# Patient Record
Sex: Female | Born: 2000 | Hispanic: Yes | Marital: Single
Health system: Southern US, Community
[De-identification: ages and names within clinical notes are randomized; demographics above are authoritative.]

---

## 2020-02-01 ENCOUNTER — Emergency Department (HOSPITAL_COMMUNITY): Payer: Self-pay

## 2020-02-01 ENCOUNTER — Encounter (HOSPITAL_COMMUNITY): Payer: Self-pay | Admitting: Emergency Medicine

## 2020-02-01 ENCOUNTER — Other Ambulatory Visit: Payer: Self-pay

## 2020-02-01 ENCOUNTER — Emergency Department (HOSPITAL_COMMUNITY)
Admission: EM | Admit: 2020-02-01 | Discharge: 2020-02-01 | Disposition: A | Discharge status: Home / Self Care | Payer: Self-pay | Attending: Emergency Medicine | Admitting: Emergency Medicine

## 2020-02-01 DIAGNOSIS — R102 Pelvic and perineal pain: Secondary | ICD-10-CM

## 2020-02-01 DIAGNOSIS — Z3A01 Less than 8 weeks gestation of pregnancy: Secondary | ICD-10-CM | POA: Insufficient documentation

## 2020-02-01 DIAGNOSIS — O99891 Other specified diseases and conditions complicating pregnancy: Secondary | ICD-10-CM | POA: Insufficient documentation

## 2020-02-01 DIAGNOSIS — R1032 Left lower quadrant pain: Secondary | ICD-10-CM | POA: Insufficient documentation

## 2020-02-01 LAB — BASIC METABOLIC PANEL
Anion gap: 9 (ref 5–15)
BUN: 12 mg/dL (ref 6–20)
CO2: 25 mmol/L (ref 22–32)
Calcium: 9.7 mg/dL (ref 8.9–10.3)
Chloride: 104 mmol/L (ref 98–111)
Creatinine, Ser: 0.78 mg/dL (ref 0.44–1.00)
GFR calc Af Amer: >60 mL/min (ref >60)
GFR calc non Af Amer: >60 mL/min (ref >60)
Glucose, Bld: 98 mg/dL (ref 70–99)
Potassium: 3.8 mmol/L (ref 3.5–5.1)
Sodium: 138 mmol/L (ref 135–145)

## 2020-02-01 LAB — CBC WITH DIFFERENTIAL/PLATELET
Abs Immature Granulocytes: 0.03 10*3/uL (ref 0.00–0.07)
Basophils Absolute: 0.1 10*3/uL (ref 0.0–0.1)
Basophils Relative: 1 %
Eosinophils Absolute: 0.3 10*3/uL (ref 0.0–0.5)
Eosinophils Relative: 2 %
HCT: 41.6 % (ref 36.0–46.0)
Hemoglobin: 13.2 g/dL (ref 12.0–15.0)
Immature Granulocytes: 0 %
Lymphocytes Relative: 29 %
Lymphs Abs: 3.5 10*3/uL (ref 0.7–4.0)
MCH: 27.2 pg (ref 26.0–34.0)
MCHC: 31.7 g/dL (ref 30.0–36.0)
MCV: 85.6 fL (ref 80.0–100.0)
Monocytes Absolute: 1.2 10*3/uL — ABNORMAL HIGH (ref 0.1–1.0)
Monocytes Relative: 10 %
Neutro Abs: 6.8 10*3/uL (ref 1.7–7.7)
Neutrophils Relative %: 58 %
Platelets: 386 10*3/uL (ref 150–400)
RBC: 4.86 MIL/uL (ref 3.87–5.11)
RDW: 13.2 % (ref 11.5–15.5)
WBC: 11.9 10*3/uL — ABNORMAL HIGH (ref 4.0–10.5)
nRBC: 0 % (ref 0.0–0.2)

## 2020-02-01 LAB — URINALYSIS, ROUTINE W REFLEX MICROSCOPIC
Bilirubin Urine: NEGATIVE
Glucose, UA: NEGATIVE mg/dL
Hgb urine dipstick: NEGATIVE
Ketones, ur: NEGATIVE mg/dL
Leukocytes,Ua: NEGATIVE
Nitrite: NEGATIVE
Protein, ur: NEGATIVE mg/dL
Specific Gravity, Urine: 1.006 (ref 1.005–1.030)
pH: 7 (ref 5.0–8.0)

## 2020-02-01 LAB — ABO/RH
ABO/RH(D): O NEG
Antibody Screen: NEGATIVE

## 2020-02-01 LAB — POC URINE PREG, ED: Preg Test, Ur: POSITIVE — AB

## 2020-02-01 LAB — WET PREP, GENITAL
Clue Cells Wet Prep HPF POC: NONE SEEN
Sperm: NONE SEEN
Trich, Wet Prep: NONE SEEN
Yeast Wet Prep HPF POC: NONE SEEN

## 2020-02-01 LAB — HCG, QUANTITATIVE, PREGNANCY: hCG, Beta Chain, Quant, S: 235 m[IU]/mL — ABNORMAL HIGH (ref <5)

## 2020-02-01 NOTE — ED Triage Notes (Addendum)
Patient c/o constant lower abdominal pain x4 days. Reports positive pregnancy test yesterday. States LMP 8/15. Patient is spanish speaking. Interpreter Mangum [phone] used to complete triage.

## 2020-02-01 NOTE — ED Provider Notes (Signed)
[facility]-EMERGENCY DEPT Provider Note   CSN: [id number] Arrival date & time: 02/01/20  2017     History Chief Complaint  Patient presents with  . Abdominal Pain    Elizabeth Kaiser is a 19 y.o. female.  The history is provided by the patient. A language interpreter was used Olegario Messier - spanish [phone]).  Abdominal Pain Pain location:  RLQ and LLQ Pain radiates to:  Does not radiate Pain severity:  Moderate Onset quality:  Gradual Duration:  4 days Timing:  Constant Progression:  Worsening Chronicity:  New Relieved by:  Nothing Worsened by:  Nothing Associated symptoms: fatigue   Associated symptoms: no cough, no dysuria, no fever, no vaginal bleeding, no vaginal discharge and no vomiting   Risk factors: pregnancy   pt is a G1P0 with lower abdominal pain for 4 days She feels like something needs to come "out" Patient request that her sister-in-law be present for entire history and physical   PMH-none Soc hx -she is a nonsmoker OB History   No obstetric history on file.     No family history on file.  Social History   Tobacco Use  . Smoking status: Not on file  Substance Use Topics  . Alcohol use: Not on file  . Drug use: Not on file    Home Medications Prior to Admission medications   Not on File    Allergies    Patient has no known allergies.  Review of Systems   Review of Systems  Constitutional: Positive for fatigue. Negative for fever.  Respiratory: Negative for cough.   Gastrointestinal: Positive for abdominal pain. Negative for vomiting.  Genitourinary: Positive for pelvic pain. Negative for dysuria, vaginal bleeding and vaginal discharge.  All other systems reviewed and are negative.   Physical Exam Updated Vital Signs BP 123/85 (BP Location: Left Arm)   Pulse 97   Temp 97.8 F (36.6 C) (Oral)   Resp 14   LMP 12/26/2019   SpO2 98%   Physical Exam CONSTITUTIONAL: Well developed/well nourished HEAD:  Normocephalic/atraumatic EYES: EOMI/PERRL ENMT: Mucous membranes moist NECK: supple no meningeal signs SPINE/BACK:entire spine nontender CV: S1/S2 noted, no murmurs/rubs/gallops noted LUNGS: Lungs are clear to auscultation bilaterally, no apparent distress ABDOMEN: soft, nontender, no rebound or guarding, bowel sounds noted throughout abdomen GU:no cva tenderness, no CMT, no discharge, no leaking of fluids.  No active bleeding.  Small amt of bleeding noted after patient was swabbed for GC/Chlamydia. Nurse tech Nettie Elm Female chaperone present. Pt declined having language interpreter present during pelvic.  She requested sister in law to be present for entire exam NEURO: Pt is awake/alert/appropriate, moves all extremitiesx4.  No facial droop.   EXTREMITIES: pulses normal/equal, full ROM SKIN: warm, color normal PSYCH: no abnormalities of mood noted, alert and oriented to situation  ED Results / Procedures / Treatments   Labs (all labs ordered are listed, but only abnormal results are displayed) Labs Reviewed  WET PREP, GENITAL - Abnormal; Notable for the following components:      Result Value   WBC, Wet Prep HPF POC FEW (*)    All other components within normal limits  CBC WITH DIFFERENTIAL/PLATELET - Abnormal; Notable for the following components:   WBC 11.9 (*)    Monocytes Absolute 1.2 (*)    All other components within normal limits  HCG, QUANTITATIVE, PREGNANCY - Abnormal; Notable for the following components:   hCG, Beta Chain, Quant, S 235 (*)    All other components within normal limits  URINALYSIS, ROUTINE W REFLEX MICROSCOPIC - Abnormal; Notable for the following components:   Color, Urine STRAW (*)    All other components within normal limits  POC URINE PREG, ED - Abnormal; Notable for the following components:   Preg Test, Ur POSITIVE (*)    All other components within normal limits  BASIC METABOLIC PANEL  ABO/RH  GC/CHLAMYDIA PROBE AMP ([place]) NOT AT [facility]     EKG None  Radiology US OB LESS THAN 14 WEEKS WITH OB TRANSVAGINAL  Result Date: 02/01/2020 CLINICAL DATA:  Initial evaluation for acute pelvic pain for 4 days. EXAM: OBSTETRIC <14 WK [place] AND TRANSVAGINAL OB US TECHNIQUE: Both transabdominal and transvaginal ultrasound examinations were performed for complete evaluation of the gestation as well as the maternal uterus, adnexal regions, and pelvic cul-de-sac. Transvaginal technique was performed to assess early pregnancy. COMPARISON:  None available. FINDINGS: Intrauterine gestational sac: Negative. Yolk sac:  Negative. Embryo:  Negative. Cardiac Activity: Negative. Subchorionic hemorrhage:  None visualized. Maternal uterus/adnexae: Ovaries are within normal limits bilaterally. Probable small degenerating corpus luteal cyst noted within the left ovary. No adnexal mass or free fluid. Endometrial stripe thickened measuring up to 23 mm. IMPRESSION: 1. Early pregnancy with no discrete IUP or adnexal mass identified. Finding is consistent with a pregnancy of unknown anatomic location. Differential considerations include IUP to early to visualize, recent SAB, or possibly occult ectopic pregnancy. Close clinical monitoring with serial beta HCGs and close interval follow-up ultrasound recommended as clinically warranted. 2. No other acute maternal uterine or adnexal abnormality identified. Electronically Signed   By: Rise Mu M.D.   On: 02/01/2020 22:20    Procedures Procedures  Medications Ordered in ED Medications - No data to display  ED Course  I have reviewed the triage vital signs and the nursing notes.  Pertinent labs & imaging results that were available during my care of the patient were reviewed by me and considered in my medical decision making (see chart for details).    MDM Rules/Calculators/A&P                          Patient is an 19 year old G1, P0 at approximately 5 weeks by LMP presenting with lower abdominal pain.  Overall her abdominal exam is unremarkable. Pelvic exam unremarkable. TV ultrasound reveals early pregnancy but no definitive IUP. hCG quant 235. Discussed with Dr. Adrian Blackwater with OB/GYN. He will arrange close follow-up this week for repeat hCG. Encourage patient to take prenatal vitamins. Discussed return precautions with interpreter. Final Clinical Impression(s) / ED Diagnoses Final diagnoses:  Less than [redacted] weeks gestation of pregnancy    Rx / DC Orders ED Discharge Orders    None       Zadie Rhine, MD 02/01/20 2229

## 2020-02-01 NOTE — ED Notes (Signed)
ED Provider at bedside. 

## 2020-02-02 LAB — GC/CHLAMYDIA PROBE AMP (~~LOC~~) NOT AT ARMC
Chlamydia: NEGATIVE
Comment: NEGATIVE
Comment: NORMAL
Neisseria Gonorrhea: NEGATIVE

## 2020-02-03 ENCOUNTER — Other Ambulatory Visit: Payer: Self-pay

## 2020-02-03 ENCOUNTER — Ambulatory Visit (INDEPENDENT_AMBULATORY_CARE_PROVIDER_SITE_OTHER): Payer: Self-pay | Admitting: *Deleted

## 2020-02-03 ENCOUNTER — Telehealth: Payer: Self-pay | Admitting: *Deleted

## 2020-02-03 VITALS — Ht 63.0 in | Wt 120.1 lb

## 2020-02-03 DIAGNOSIS — O3680X0 Pregnancy with inconclusive fetal viability, not applicable or unspecified: Secondary | ICD-10-CM

## 2020-02-03 LAB — BETA HCG QUANT (REF LAB): hCG Quant: 466 m[IU]/mL

## 2020-02-03 NOTE — Progress Notes (Signed)
Stat Beta Hcg 466. Spoke with Dr. Donavan Foil who recommends Stat beta Hcg on Saturday and a follow up US in 2 weeks.   [place] scheduled for 10/05 at 10:00, patient is to arrive at 9:45 am with a full bladder.   Stat Beta orders placed to be drawn in MAU on Saturday.   Called patient at 1:16 pm with assistance of [employer], Spanish Interpreter to give results and recommendation for follow up. Patient did not answer. Will attempt again at a later time.

## 2020-02-03 NOTE — Progress Notes (Signed)
Here for stat bhcg. C/o moderate pelvic  Pain- today, states was strong when she went to ED but not today. . Denies  Bleeding today.  Explained we will draw stat bhcg and then she can leave and we will call her in about 2 hours with results and plan of care.  She voices understanding.  Ethel Veronica,RN

## 2020-02-03 NOTE — Telephone Encounter (Signed)
Pt left VM message with her phone number only.  She did not state her question or concern.

## 2020-02-03 NOTE — Progress Notes (Signed)
Called patient with Elizabeth Kaiser, Interpreter and gave her results/ plan of care . Also reviewed ectopic precautions with her.  She voices understanding. Vang Kraeger,RN

## 2020-02-04 NOTE — Telephone Encounter (Addendum)
Patient was called at end of day yesterday with results.  I called Carlee today with Jaci Lazier [phone] And informed her I was calling back to follow up if her question was answered or was after her last call with me yesterday. She states she wanted to know address of where she goes tomorrow. I gave her name and address [facility] She voices understanding. Fionna Merriott,RN

## 2020-02-04 NOTE — Progress Notes (Signed)
Patient was assessed and managed by nursing staff during this encounter. I have reviewed the chart and agree with the documentation and plan. I have also made any necessary editorial changes.  Warden Fillers, MD 02/04/2020 8:10 AM

## 2020-02-05 ENCOUNTER — Encounter (HOSPITAL_COMMUNITY): Payer: Self-pay | Admitting: Obstetrics & Gynecology

## 2020-02-05 ENCOUNTER — Inpatient Hospital Stay (HOSPITAL_COMMUNITY)
Admission: AD | Admit: 2020-02-05 | Discharge: 2020-02-05 | Disposition: A | Discharge status: Home / Self Care | Payer: Self-pay | Attending: Obstetrics & Gynecology | Admitting: Obstetrics & Gynecology

## 2020-02-05 ENCOUNTER — Other Ambulatory Visit: Payer: Self-pay

## 2020-02-05 DIAGNOSIS — O0281 Inappropriate change in quantitative human chorionic gonadotropin (hCG) in early pregnancy: Secondary | ICD-10-CM

## 2020-02-05 DIAGNOSIS — E349 Endocrine disorder, unspecified: Secondary | ICD-10-CM

## 2020-02-05 DIAGNOSIS — Z3A01 Less than 8 weeks gestation of pregnancy: Secondary | ICD-10-CM | POA: Insufficient documentation

## 2020-02-05 DIAGNOSIS — O26891 Other specified pregnancy related conditions, first trimester: Secondary | ICD-10-CM | POA: Insufficient documentation

## 2020-02-05 DIAGNOSIS — R799 Abnormal finding of blood chemistry, unspecified: Secondary | ICD-10-CM | POA: Insufficient documentation

## 2020-02-05 LAB — HCG, QUANTITATIVE, PREGNANCY: hCG, Beta Chain, Quant, S: 1184 m[IU]/mL — ABNORMAL HIGH (ref <5)

## 2020-02-05 NOTE — Discharge Instructions (Signed)
First Trimester of Pregnancy The first trimester of pregnancy is from week 1 until the end of week 13 (months 1 through 3). A week after a sperm fertilizes an egg, the egg will implant on the wall of the uterus. This embryo will begin to develop into a baby. Genes from you and your partner will form the baby. The female genes will determine whether the baby will be a boy or a girl. At 6-8 weeks, the eyes and face will be formed, and the heartbeat can be seen on ultrasound. At the end of 12 weeks, all the baby's organs will be formed. Now that you are pregnant, you will want to do everything you can to have a healthy baby. Two of the most important things are to get good prenatal care and to follow your health care provider's instructions. Prenatal care is all the medical care you receive before the baby's birth. This care will help prevent, find, and treat any problems during the pregnancy and childbirth. Body changes during your first trimester Your body goes through many changes during pregnancy. The changes vary from woman to woman.  You may gain or lose a couple of pounds at first.  You may feel sick to your stomach (nauseous) and you may throw up (vomit). If the vomiting is uncontrollable, call your health care provider.  You may tire easily.  You may develop headaches that can be relieved by medicines. All medicines should be approved by your health care provider.  You may urinate more often. Painful urination may mean you have a bladder infection.  You may develop heartburn as a result of your pregnancy.  You may develop constipation because certain hormones are causing the muscles that push stool through your intestines to slow down.  You may develop hemorrhoids or swollen veins (varicose veins).  Your breasts may begin to grow larger and become tender. Your nipples may stick out more, and the tissue that surrounds them (areola) may become darker.  Your gums may bleed and may be  sensitive to brushing and flossing.  Dark spots or blotches (chloasma, mask of pregnancy) may develop on your face. This will likely fade after the baby is born.  Your menstrual periods will stop.  You may have a loss of appetite.  You may develop cravings for certain kinds of food.  You may have changes in your emotions from day to day, such as being excited to be pregnant or being concerned that something may go wrong with the pregnancy and baby.  You may have more vivid and strange dreams.  You may have changes in your hair. These can include thickening of your hair, rapid growth, and changes in texture. Some women also have hair loss during or after pregnancy, or hair that feels dry or thin. Your hair will most likely return to normal after your baby is born. What to expect at prenatal visits During a routine prenatal visit:  You will be weighed to make sure you and the baby are growing normally.  Your blood pressure will be taken.  Your abdomen will be measured to track your baby's growth.  The fetal heartbeat will be listened to between weeks 10 and 14 of your pregnancy.  Test results from any previous visits will be discussed. Your health care provider may ask you:  How you are feeling.  If you are feeling the baby move.  If you have had any abnormal symptoms, such as leaking fluid, bleeding, severe headaches, or abdominal   cramping.  If you are using any tobacco products, including cigarettes, chewing tobacco, and electronic cigarettes.  If you have any questions. Other tests that may be performed during your first trimester include:  Blood tests to find your blood type and to check for the presence of any previous infections. The tests will also be used to check for low iron levels (anemia) and protein on red blood cells (Rh antibodies). Depending on your risk factors, or if you previously had diabetes during pregnancy, you may have tests to check for high blood sugar  that affects pregnant women (gestational diabetes).  Urine tests to check for infections, diabetes, or protein in the urine.  An ultrasound to confirm the proper growth and development of the baby.  Fetal screens for spinal cord problems (spina bifida) and Down syndrome.  HIV (human immunodeficiency virus) testing. Routine prenatal testing includes screening for HIV, unless you choose not to have this test.  You may need other tests to make sure you and the baby are doing well. Follow these instructions at home: Medicines  Follow your health care provider's instructions regarding medicine use. Specific medicines may be either safe or unsafe to take during pregnancy.  Take a prenatal vitamin that contains at least 600 micrograms (mcg) of folic acid.  If you develop constipation, try taking a stool softener if your health care provider approves. Eating and drinking   Eat a balanced diet that includes fresh fruits and vegetables, whole grains, good sources of protein such as meat, eggs, or tofu, and low-fat dairy. Your health care provider will help you determine the amount of weight gain that is right for you.  Avoid raw meat and uncooked cheese. These carry germs that can cause birth defects in the baby.  Eating four or five small meals rather than three large meals a day may help relieve nausea and vomiting. If you start to feel nauseous, eating a few soda crackers can be helpful. Drinking liquids between meals, instead of during meals, also seems to help ease nausea and vomiting.  Limit foods that are high in fat and processed sugars, such as fried and sweet foods.  To prevent constipation: ? Eat foods that are high in fiber, such as fresh fruits and vegetables, whole grains, and beans. ? Drink enough fluid to keep your urine clear or pale yellow. Activity  Exercise only as directed by your health care provider. Most women can continue their usual exercise routine during  pregnancy. Try to exercise for 30 minutes at least 5 days a week. Exercising will help you: ? Control your weight. ? Stay in shape. ? Be prepared for labor and delivery.  Experiencing pain or cramping in the lower abdomen or lower back is a good sign that you should stop exercising. Check with your health care provider before continuing with normal exercises.  Try to avoid standing for long periods of time. Move your legs often if you must stand in one place for a long time.  Avoid heavy lifting.  Wear low-heeled shoes and practice good posture.  You may continue to have sex unless your health care provider tells you not to. Relieving pain and discomfort  Wear a good support bra to relieve breast tenderness.  Take warm sitz baths to soothe any pain or discomfort caused by hemorrhoids. Use hemorrhoid cream if your health care provider approves.  Rest with your legs elevated if you have leg cramps or low back pain.  If you develop varicose veins in   your legs, wear support hose. Elevate your feet for 15 minutes, 3-4 times a day. Limit salt in your diet. Prenatal care  Schedule your prenatal visits by the twelfth week of pregnancy. They are usually scheduled monthly at first, then more often in the last 2 months before delivery.  Write down your questions. Take them to your prenatal visits.  Keep all your prenatal visits as told by your health care provider. This is important. Safety  Wear your seat belt at all times when driving.  Make a list of emergency phone numbers, including numbers for family, friends, the hospital, and police and fire departments. General instructions  Ask your health care provider for a referral to a local prenatal education class. Begin classes no later than the beginning of month 6 of your pregnancy.  Ask for help if you have counseling or nutritional needs during pregnancy. Your health care provider can offer advice or refer you to specialists for help  with various needs.  Do not use hot tubs, steam rooms, or saunas.  Do not douche or use tampons or scented sanitary pads.  Do not cross your legs for long periods of time.  Avoid cat litter boxes and soil used by cats. These carry germs that can cause birth defects in the baby and possibly loss of the fetus by miscarriage or stillbirth.  Avoid all smoking, herbs, alcohol, and medicines not prescribed by your health care provider. Chemicals in these products affect the formation and growth of the baby.  Do not use any products that contain nicotine or tobacco, such as cigarettes and e-cigarettes. If you need help quitting, ask your health care provider. You may receive counseling support and other resources to help you quit.  Schedule a dentist appointment. At home, brush your teeth with a soft toothbrush and be gentle when you floss. Contact a health care provider if:  You have dizziness.  You have mild pelvic cramps, pelvic pressure, or nagging pain in the abdominal area.  You have persistent nausea, vomiting, or diarrhea.  You have a bad smelling vaginal discharge.  You have pain when you urinate.  You notice increased swelling in your face, hands, legs, or ankles.  You are exposed to fifth disease or chickenpox.  You are exposed to [place] measles (rubella) and have never had it. Get help right away if:  You have a fever.  You are leaking fluid from your vagina.  You have spotting or bleeding from your vagina.  You have severe abdominal cramping or pain.  You have rapid weight gain or loss.  You vomit blood or material that looks like coffee grounds.  You develop a severe headache.  You have shortness of breath.  You have any kind of trauma, such as from a fall or a car accident. Summary  The first trimester of pregnancy is from week 1 until the end of week 13 (months 1 through 3).  Your body goes through many changes during pregnancy. The changes vary from  woman to woman.  You will have routine prenatal visits. During those visits, your health care provider will examine you, discuss any test results you may have, and talk with you about how you are feeling. This information is not intended to replace advice given to you by your health care provider. Make sure you discuss any questions you have with your health care provider. Document Revised: 04/11/2017 Document Reviewed: 04/10/2016 Elsevier Patient Education  2020 [employer].  Safe Medications in Pregnancy  Acne: Benzoyl Peroxide Salicylic Acid  Backache/Headache: Tylenol: 2 regular strength every 4 hours OR              2 Extra strength every 6 hours  Colds/Coughs/Allergies: Benadryl (alcohol free) 25 mg every 6 hours as needed Breath right strips Claritin Cepacol throat lozenges Chloraseptic throat spray Cold-Eeze- up to three times per day Cough drops, alcohol free Flonase (by prescription only) Guaifenesin Mucinex Robitussin DM (plain only, alcohol free) Saline nasal spray/drops Sudafed (pseudoephedrine) & Actifed ** use only after [redacted] weeks gestation and if you do not have high blood pressure Tylenol Vicks Vaporub Zinc lozenges Zyrtec   Constipation: Colace Ducolax suppositories Fleet enema Glycerin suppositories Metamucil Milk of magnesia Miralax Senokot Smooth move tea  Diarrhea: Kaopectate Imodium A-D  *NO pepto Bismol  Hemorrhoids: Anusol Anusol HC Preparation H Tucks  Indigestion: Tums Maalox Mylanta Zantac  Pepcid  Insomnia: Benadryl (alcohol free) 25mg every 6 hours as needed Tylenol PM Unisom, no Gelcaps  Leg Cramps: Tums MagGel  Nausea/Vomiting:  Bonine Dramamine Emetrol Ginger extract Sea bands Meclizine  Nausea medication to take during pregnancy:  Unisom (doxylamine succinate 25 mg tablets) Take one tablet daily at bedtime. If symptoms are not adequately controlled, the dose can be increased to a maximum recommended  dose of two tablets daily (1/2 tablet in the morning, 1/2 tablet mid-afternoon and one at bedtime). Vitamin B6 100mg tablets. Take one tablet twice a day (up to 200 mg per day).  Skin Rashes: Aveeno products Benadryl cream or 25mg every 6 hours as needed Calamine Lotion 1% cortisone cream  Yeast infection: Gyne-lotrimin 7 Monistat 7   **If taking multiple medications, please check labels to avoid duplicating the same active ingredients **take medication as directed on the label ** Do not exceed 4000 mg of tylenol in 24 hours **Do not take medications that contain aspirin or ibuprofen      

## 2020-02-05 NOTE — MAU Note (Signed)
Elizabeth Kaiser is a 19 y.o. here in MAU reporting: here for follow up hcg. Denies pain or bleeding.  Pain score: 0/10  Vitals:   02/05/20 0839  BP: (!) 113/59  Pulse: 95  Resp: 16  Temp: 98.9 F (37.2 C)  SpO2: 100%     Lab orders placed from triage: hcg

## 2020-02-05 NOTE — MAU Provider Note (Signed)
History   Chief Complaint:  Follow-up   Elizabeth Kaiser is  19 y.o. G1P0 Patient's last menstrual period was 12/26/2019.Marland Kitchen Patient is here for follow up of quantitative HCG and ongoing surveillance of pregnancy status. She is [redacted]w[redacted]d weeks gestation  by LMP.    Since her last visit, the patient is without new complaint. The patient reports bleeding as  none now.  She denies any pain.  General ROS:  negative  Her previous Quantitative HCG values are:  Results for Elizabeth, Kaiser (MRN [id number]) as of 02/05/2020 10:09  Ref. Range 02/01/2020 20:50  HCG, Beta Chain, Quant, S Latest Ref Range: <5 mIU/mL 235 (H)   Results for Elizabeth, Kaiser (MRN [id number]) as of 02/05/2020 10:09  Ref. Range 02/03/2020 11:15  hCG Quant Latest Units: mIU/mL 466    Physical Exam   Blood pressure (!) 113/59, pulse 95, temperature 98.9 F (37.2 C), temperature source Oral, resp. rate 16, last menstrual period 12/26/2019, SpO2 100 %.  Focused Gynecological Exam: examination not indicated  Labs: Results for orders placed or performed during the hospital encounter of 02/05/20 (from the past 24 hour(s))  hCG, quantitative, pregnancy   Collection Time: 02/05/20  8:46 AM  Result Value Ref Range   hCG, Beta Chain, Quant, S 1,184 (H) <5 mIU/mL    Assessment:   1. Elevated serum hCG   2. [redacted] weeks gestation of pregnancy     Plan: -Discharge home in stable condition -First trimester precautions discussed -Patient advised to follow-up with [facility] on 10/5 for ultrasound as scheduled -Patient may return to MAU as needed or if her condition were to change or worsen  Rolm Bookbinder, CNM 02/05/2020, 10:09 AM

## 2020-02-15 ENCOUNTER — Other Ambulatory Visit: Payer: Self-pay

## 2020-02-15 ENCOUNTER — Ambulatory Visit
Admission: RE | Admit: 2020-02-15 | Discharge: 2020-02-15 | Disposition: A | Discharge status: Home / Self Care | Payer: Self-pay | Source: Ambulatory Visit | Attending: Obstetrics and Gynecology | Admitting: Obstetrics and Gynecology

## 2020-02-15 DIAGNOSIS — O3680X0 Pregnancy with inconclusive fetal viability, not applicable or unspecified: Secondary | ICD-10-CM

## 2020-03-03 ENCOUNTER — Encounter: Payer: Self-pay | Admitting: Student in an Organized Health Care Education/Training Program

## 2020-03-10 ENCOUNTER — Other Ambulatory Visit: Payer: Self-pay

## 2020-03-10 ENCOUNTER — Ambulatory Visit (INDEPENDENT_AMBULATORY_CARE_PROVIDER_SITE_OTHER): Payer: Self-pay | Admitting: Student in an Organized Health Care Education/Training Program

## 2020-03-10 VITALS — BP 99/70 | Ht 66.14 in | Wt 121.6 lb

## 2020-03-10 DIAGNOSIS — Z3491 Encounter for supervision of normal pregnancy, unspecified, first trimester: Secondary | ICD-10-CM

## 2020-03-10 DIAGNOSIS — Z3490 Encounter for supervision of normal pregnancy, unspecified, unspecified trimester: Secondary | ICD-10-CM | POA: Insufficient documentation

## 2020-03-10 DIAGNOSIS — Z3A10 10 weeks gestation of pregnancy: Secondary | ICD-10-CM

## 2020-03-10 LAB — POCT URINALYSIS DIP (MANUAL ENTRY)
Bilirubin, UA: NEGATIVE
Blood, UA: NEGATIVE
Glucose, UA: NEGATIVE mg/dL
Ketones, POC UA: NEGATIVE mg/dL
Leukocytes, UA: NEGATIVE
Nitrite, UA: NEGATIVE
Protein Ur, POC: NEGATIVE mg/dL
Spec Grav, UA: 1.015 (ref 1.010–1.025)
Urobilinogen, UA: 0.2 E.U./dL
pH, UA: 7 (ref 5.0–8.0)

## 2020-03-10 NOTE — Patient Instructions (Addendum)
It was a pleasure to see you today!  To summarize our discussion for this visit:  We are obtaining basic blood work and urine testing for initial OB visit. You will return at your earliest convenience for a thorough OB visit where your provider can review the information from the labs with you as well as go over many more questions.   For the nausea, you can use  Doxylamine + vitamin B6  Ginger lozenges Small frequent meals every two hours starting from when you wake up Avoid spicy or greasy foods  Some additional health maintenance measures we should update are: Health Maintenance Due  Topic Date Due  . Hepatitis C Screening  Never done  . HIV Screening  Never done  . INFLUENZA VACCINE  Never done  . TETANUS/TDAP  Never done  .    Call the clinic at [phone] if your symptoms worsen or you have any concerns.   Thank you for allowing me to take part in your care,  Dr. Jamelle Rushing   Primer trimestre de Elizabeth Kaiser First Trimester of Pregnancy  El primer trimestre de embarazo se extiende desde la semana1 hasta el final de la semana13 (mes1 al mes3). Durante este tiempo, el beb comenzar a desarrollarse dentro suyo. Entre la semana6 y [place], se forman los ojos y Recruitment consultant, y los latidos del corazn pueden escucharse en la ecografa. Al final de las 12semanas, todos los rganos del beb estn formados. El cuidado prenatal es toda la asistencia mdica que usted recibe antes del nacimiento del beb. Asegrese de recibir un buen cuidado prenatal y de seguir todas las indicaciones del mdico. Siga estas indicaciones en su casa: Medicamentos  Tome los medicamentos de venta libre y los recetados solamente como se lo haya indicado el mdico. Algunos medicamentos son seguros para tomar durante el Psychiatrist y otros no lo son.  Tome vitaminas prenatales que contengan por lo menos (?g) de cido flico.  Si tiene problemas para defecar (estreimiento), tome un  medicamento que ablanda la materia fecal (laxante), siempre que lo autorice el mdico. Comida y bebida   Ingiera alimentos saludables de [place] regular.  El Firefighter la cantidad de peso que [place].  No coma carne cruda ni quesos sin cocinar.  Si tiene Programme researcher, broadcasting/film/video (nuseas) o vomita (vmitos): ? Ingiera 4 o 5comidas pequeas por Geophysical data processor de 3abundantes. ? Intente comer algunas galletitas saladas. ? Beba lquidos [employer], en lugar de [employer].  Para evitar el estreimiento: ? Consuma alimentos ricos en fibra, como frutas y verduras frescas, cereales integrales y legumbres. ? Beba suficiente lquido para mantener el pis (orina) claro o de color amarillo plido. Actividad  Haga ejercicios solamente como se lo haya indicado el mdico. Deje de hacer ejercicios si tiene clicos o dolor en la parte baja del vientre (abdomen) o en la cintura.  No haga actividad fsica si el clima est demasiado caluroso o hmedo, o si se encuentra en un lugar muy alto (altitud elevada).  Intente no estar de pie [employer]. Mueva las piernas con frecuencia si debe estar de pie en un lugar durante mucho tiempo.  Evite levantar pesos [employer].  Use zapatos con tacones bajos. Mantenga una buena postura al sentarse y pararse.  Puede tener [employer] [place], a menos que el mdico le indique lo contrario. Alivio del dolor y del Dentist  Use un sostn que le brinde buen soporte si le duelen las [place].  Dese baos  de asiento con agua tibia para Engineer, materials o las molestias causadas por las hemorroides. Use una crema antihemorroidal si el mdico se lo permite.  Descanse con las piernas elevadas si tiene calambres o dolor de cintura.  Si tiene las venas de las piernas hinchadas y abultadas (venas varicosas): ? Use medias elsticas de soporte o medias de compresin como se lo haya indicado el mdico. ? Levante (eleve) los pies durante  , 3 o 4veces por Futures trader. ? Limite la sal en sus alimentos. Cuidado prenatal  Programe las visitas prenatales para la semana12 de [place].  Escriba sus preguntas. Llvelas cuando concurra a las visitas prenatales.  Concurra a todas las visitas prenatales como se lo haya indicado el mdico. Esto es importante. Seguridad  Use el cinturn de seguridad en todo momento mientras conduce.  Haga una lista con los nmeros de telfono en caso de Associate Professor. Esta lista debe incluir los nmeros de los familiares, los amigos, el hospital y los departamentos de polica y de bomberos. Instrucciones generales  Pdale al mdico que la derive a clases prenatales en su localidad. Debe comenzar a tomar las clases antes de Cytogeneticist en el mes6 de embarazo.  Pida ayuda si necesita asesoramiento o asistencia con la alimentacin. El mdico puede aconsejarla o indicarle dnde recurrir para recibir [place].  No se d baos de inmersin en agua caliente, baos turcos ni saunas.  No se haga duchas vaginales ni use tampones o toallas higinicas perfumadas.  No mantenga las piernas cruzadas durante [place].  Evite las hierbas y el alcohol. Evite los frmacos que el mdico no haya autorizado.  No consuma ningn producto que contenga tabaco, lo que incluye cigarrillos, tabaco de Theatre manager o Administrator, Civil Service. Si necesita ayuda para dejar de fumar, consulte al [employer]. Puede recibir asesoramiento u otro tipo de apoyo para dejar de fumar.  Evite el contacto con las bandejas sanitarias de los gatos y la tierra que estos animales usan. Estos elementos contienen grmenes que pueden causar defectos congnitos al beb y la posible prdida del feto (aborto espontneo) o muerte fetal.  Visite al dentista. En su casa, lvese los dientes con un cepillo dental suave. Psese el hilo dental con suavidad. Comunquese con un mdico si:  Tiene mareos.  Tiene clicos leves o siente presin en la parte baja del  vientre.  Sufre un dolor persistente en el abdomen.  Sigue teniendo [employer], vomita o la materia fecal es lquida (diarrea).  Nota una secrecin de lquido con olor ftido que proviene de la vagina.  Tiene dolor al hacer pis (orinar).  Tiene el rostro, las [place], las piernas o los tobillos ms hinchados (inflamados). Solicite ayuda de inmediato si:  Tiene fiebre.  Tiene una prdida de lquido por la vagina.  Tiene sangrado o pequeas prdidas vaginales.  Tiene clicos o dolor muy intensos en el vientre.  Sube o baja de peso rpidamente.  Vomita sangre. Esto tiene Art gallery manager similar a la borra del caf.  Est en contacto con personas que tienen rubola, la quinta enfermedad o varicela.  Siente un dolor de cabeza muy intenso.  Le falta el aire.  Sufre cualquier tipo de traumatismo, por ejemplo, debido a una cada o un accidente automovilstico. Resumen  El primer trimestre de Psychiatrist se extiende desde la semana1 hasta el final de la semana13 (mes1 al mes3).  Para cuidar su salud y la del beb en gestacin, necesitar consumir alimentos saludables, tomar medicamentos solamente si lo autoriza el mdico, y Radio producer  actividades que sean seguras para usted y para su beb.  Concurra a todas las visitas de control como se lo haya indicado el mdico. Esto es importante porque el mdico deber asegurar que el beb est saludable y est creciendo bien. Esta informacin no tiene Theme park manager el consejo del mdico. Asegrese de hacerle al mdico cualquier pregunta que tenga. Document Revised: 12/03/2016 Document Reviewed: 12/03/2016 Elsevier Patient Education  2020 [employer].

## 2020-03-10 NOTE — Progress Notes (Signed)
  Patient Name: Elizabeth Kaiser Date of Birth: [birth year] [facility] Initial Prenatal Visit  Elizabeth Kaiser is a 19 y.o. year old G1P0 at [redacted]w[redacted]d who presents for her initial prenatal visit. Pregnancy is not planned She reports morning sickness and nausea. She is taking a prenatal vitamin.  She denies pelvic pain or vaginal bleeding.   Feeling good overall.  Vomiting in the morning and nausea in the afternoons.  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  Endorses 4-5lbs weight loss since nausea started but she is actually up a pound since last month in the chart, informed patient. First pregnancy.  It was not planned. She has mixed feeling about the pregnancy. Father of the baby is supportive.  No questions at this time.  No known family history of pregnancy complications or birth defects.   - [place] 9/21 showed early pregnancy without known location due to early date - [place] 10/5 showed normal IUP at [redacted]w[redacted]d and [facility] 10/11/2020  Pregnancy Dating: . The patient is dated by LMP and early [place].  Patient's last menstrual period was 12/26/2019.  Lab Review: . Collected at this visit. To be reviewed for initial OB visit  PMH: Reviewed and as detailed below: . HTN: No  . Gestational Hypertension/preeclampsia: No  . Type 1 or 2 Diabetes: No  . Depression:  No  . Seizure disorder:  No . VTE: No ,  . History of STI No,  . Abnormal Pap smear:  No, . Genital herpes simplex:  No   PSH: . Gynecologic Surgery:  no  Obstetric History: . Obstetric history tab updated and reviewed.  . Summary of prior pregnancies: none  Current Medications:  . Prenatal vitamin . Reviewed and appropriate in pregnancy.   Prenatal Exam: Gen: Well nourished, well developed.  No distress.  Vitals noted.  Assessment/Plan:  Elizabeth Kaiser is a 19 y.o. G1P0 at [redacted]w[redacted]d who presents to initiate prenatal care. She is doing well.  Current pregnancy issues include  nausea/vomiting.  1. Routine prenatal care: Marland Kitchen Pre-pregnancy weight updated. Expected weight gain this pregnancy is 25-35 pounds  . Indications for referral to HROB were reviewed and the patient does not meet criteria for referral.  . Medication list reviewed and updated.  . Importance of prenatal vitamins reviewed.   2. Pregnancy issues include the following which were addressed today:  . Prenatal labs collected today   Follow up next week for next prenatal visit.

## 2020-03-10 NOTE — Progress Notes (Deleted)
° ° °  SUBJECTIVE:   CHIEF COMPLAINT / HPI:   ***  PERTINENT  PMH / PSH: ***  OBJECTIVE:   LMP 12/26/2019   ***  ASSESSMENT/PLAN:   No problem-specific Assessment & Plan notes found for this encounter.     Leeroy Bock, DO [facility]

## 2020-03-10 NOTE — Assessment & Plan Note (Signed)
Prenatal labs collected today.  Gave recommendations for nausea.  Initial OB scheduled

## 2020-03-12 LAB — CULTURE, OB URINE

## 2020-03-12 LAB — URINE CULTURE, OB REFLEX: Organism ID, Bacteria: NO GROWTH

## 2020-03-13 LAB — OBSTETRIC PANEL, INCLUDING HIV
Antibody Screen: NEGATIVE
Basophils Absolute: 0 10*3/uL (ref 0.0–0.2)
Basos: 0 %
EOS (ABSOLUTE): 0.1 10*3/uL (ref 0.0–0.4)
Eos: 1 %
HIV Screen 4th Generation wRfx: NONREACTIVE
Hematocrit: 36.6 % (ref 34.0–46.6)
Hemoglobin: 12.2 g/dL (ref 11.1–15.9)
Hepatitis B Surface Ag: NEGATIVE
Immature Grans (Abs): 0 10*3/uL (ref 0.0–0.1)
Immature Granulocytes: 0 %
Lymphocytes Absolute: 2 10*3/uL (ref 0.7–3.1)
Lymphs: 21 %
MCH: 27.7 pg (ref 26.6–33.0)
MCHC: 33.3 g/dL (ref 31.5–35.7)
MCV: 83 fL (ref 79–97)
Monocytes Absolute: 1 10*3/uL — ABNORMAL HIGH (ref 0.1–0.9)
Monocytes: 11 %
Neutrophils Absolute: 6.2 10*3/uL (ref 1.4–7.0)
Neutrophils: 67 %
Platelets: 353 10*3/uL (ref 150–450)
RBC: 4.4 x10E6/uL (ref 3.77–5.28)
RDW: 13 % (ref 11.7–15.4)
RPR Ser Ql: NONREACTIVE
Rh Factor: NEGATIVE
Rubella Antibodies, IGG: 11.8 index (ref >0.99)
WBC: 9.3 10*3/uL (ref 3.4–10.8)

## 2020-03-13 LAB — HGB FRACTIONATION CASCADE
Hgb A2: 2.6 % (ref 1.8–3.2)
Hgb A: 96.4 % (ref 96.4–98.8)
Hgb F: 1 % (ref 0.0–2.0)
Hgb S: 0 %

## 2020-03-13 LAB — HEPATITIS C ANTIBODY: Hep C Virus Ab: 0.2 s/co ratio (ref 0.0–0.9)

## 2020-03-17 ENCOUNTER — Encounter: Payer: Self-pay | Admitting: Student in an Organized Health Care Education/Training Program

## 2020-03-21 ENCOUNTER — Encounter: Payer: Self-pay | Admitting: Student in an Organized Health Care Education/Training Program

## 2020-03-22 ENCOUNTER — Other Ambulatory Visit: Payer: Self-pay

## 2020-03-22 ENCOUNTER — Other Ambulatory Visit (HOSPITAL_COMMUNITY)
Admission: RE | Admit: 2020-03-22 | Discharge: 2020-03-22 | Disposition: A | Discharge status: Home / Self Care | Payer: Self-pay | Source: Ambulatory Visit | Attending: Family Medicine | Admitting: Family Medicine

## 2020-03-22 ENCOUNTER — Ambulatory Visit (INDEPENDENT_AMBULATORY_CARE_PROVIDER_SITE_OTHER): Payer: Self-pay | Admitting: Student in an Organized Health Care Education/Training Program

## 2020-03-22 VITALS — BP 100/64 | HR 74 | Wt 119.0 lb

## 2020-03-22 DIAGNOSIS — Z3491 Encounter for supervision of normal pregnancy, unspecified, first trimester: Secondary | ICD-10-CM | POA: Insufficient documentation

## 2020-03-22 NOTE — Patient Instructions (Addendum)
It was a pleasure to see you today!  To summarize our discussion for this visit:  Your visit was very normal today.  At your next visit, you can get your quad screen and we can schedule you for an anatomy ultrasound.   If anything abnormal comes from your swab today, I will call to let you know before your next appointment.  I would like to follow your weight closely as you lost a couple pounds from your last visit. This may improve now that your nausea has improved.   Please go to the health department for your flu shot. We are providing a letter of recommendation.  Some additional health maintenance measures we should update are: Health Maintenance Due  Topic Date Due  . INFLUENZA VACCINE  Never done  . TETANUS/TDAP  Never done  .    Call the clinic at [phone] if your symptoms worsen or you have any concerns.   Thank you for allowing me to take part in your care,  Dr. Jamelle Rushing    Primer trimestre de Vanetta Mulders First Trimester of Pregnancy  El primer trimestre de embarazo se extiende desde la semana1 hasta el final de la semana13 (mes1 al mes3). Durante este tiempo, el beb comenzar a desarrollarse dentro suyo. Entre la semana6 y [place], se forman los ojos y Recruitment consultant, y los latidos del corazn pueden escucharse en la ecografa. Al final de las 12semanas, todos los rganos del beb estn formados. El cuidado prenatal es toda la asistencia mdica que usted recibe antes del nacimiento del beb. Asegrese de recibir un buen cuidado prenatal y de seguir todas las indicaciones del mdico. Siga estas indicaciones en su casa: Medicamentos  Tome los medicamentos de venta libre y los recetados solamente como se lo haya indicado el mdico. Algunos medicamentos son seguros para tomar durante el Psychiatrist y otros no lo son.  Tome vitaminas prenatales que contengan por lo menos (?g) de cido flico.  Si tiene problemas para defecar (estreimiento), tome un  medicamento que ablanda la materia fecal (laxante), siempre que lo autorice el mdico. Comida y bebida   Ingiera alimentos saludables de [place] regular.  El Firefighter la cantidad de peso que [place].  No coma carne cruda ni quesos sin cocinar.  Si tiene Programme researcher, broadcasting/film/video (nuseas) o vomita (vmitos): ? Ingiera 4 o 5comidas pequeas por Geophysical data processor de 3abundantes. ? Intente comer algunas galletitas saladas. ? Beba lquidos [employer], en lugar de [employer].  Para evitar el estreimiento: ? Consuma alimentos ricos en fibra, como frutas y verduras frescas, cereales integrales y legumbres. ? Beba suficiente lquido para mantener el pis (orina) claro o de color amarillo plido. Actividad  Haga ejercicios solamente como se lo haya indicado el mdico. Deje de hacer ejercicios si tiene clicos o dolor en la parte baja del vientre (abdomen) o en la cintura.  No haga actividad fsica si el clima est demasiado caluroso o hmedo, o si se encuentra en un lugar muy alto (altitud elevada).  Intente no estar de pie [employer]. Mueva las piernas con frecuencia si debe estar de pie en un lugar durante mucho tiempo.  Evite levantar pesos [employer].  Use zapatos con tacones bajos. Mantenga una buena postura al sentarse y pararse.  Puede tener [employer], a menos que el mdico le indique lo contrario. Alivio del dolor y del Dentist  Use un sostn que le brinde buen soporte si le duelen las [place].  Dese baos de asiento con agua tibia para Engineer, materials o las molestias causadas por las hemorroides. Use una crema antihemorroidal si el mdico se lo permite.  Descanse con las piernas elevadas si tiene calambres o dolor de cintura.  Si tiene las venas de las piernas hinchadas y abultadas (venas varicosas): ? Use medias elsticas de soporte o medias de compresin como se lo haya indicado el mdico. ? Levante (eleve) los pies durante  , 3 o 4veces por Futures trader. ? Limite la sal en sus alimentos. Cuidado prenatal  Programe las visitas prenatales para la semana12 de [place].  Escriba sus preguntas. Llvelas cuando concurra a las visitas prenatales.  Concurra a todas las visitas prenatales como se lo haya indicado el mdico. Esto es importante. Seguridad  Use el cinturn de seguridad en todo momento mientras conduce.  Haga una lista con los nmeros de telfono en caso de Associate Professor. Esta lista debe incluir los nmeros de los familiares, los amigos, el hospital y los departamentos de polica y de bomberos. Instrucciones generales  Pdale al mdico que la derive a clases prenatales en su localidad. Debe comenzar a tomar las clases antes de Cytogeneticist en el mes6 de embarazo.  Pida ayuda si necesita asesoramiento o asistencia con la alimentacin. El mdico puede aconsejarla o indicarle dnde recurrir para recibir [place].  No se d baos de inmersin en agua caliente, baos turcos ni saunas.  No se haga duchas vaginales ni use tampones o toallas higinicas perfumadas.  No mantenga las piernas cruzadas durante [place].  Evite las hierbas y el alcohol. Evite los frmacos que el mdico no haya autorizado.  No consuma ningn producto que contenga tabaco, lo que incluye cigarrillos, tabaco de Theatre manager o Administrator, Civil Service. Si necesita ayuda para dejar de fumar, consulte al [employer] Express. Puede recibir asesoramiento u otro tipo de apoyo para dejar de fumar.  Evite el contacto con las bandejas sanitarias de los gatos y la tierra que estos animales usan. Estos elementos contienen grmenes que pueden causar defectos congnitos al beb y la posible prdida del feto (aborto espontneo) o muerte fetal.  Visite al dentista. En su casa, lvese los dientes con un cepillo dental suave. Psese el hilo dental con suavidad. Comunquese con un mdico si:  Tiene mareos.  Tiene clicos leves o siente presin en la parte baja del  vientre.  Sufre un dolor persistente en el abdomen.  Sigue teniendo [employer], vomita o la materia fecal es lquida (diarrea).  Nota una secrecin de lquido con olor ftido que proviene de la vagina.  Tiene dolor al hacer pis (orinar).  Tiene el rostro, las [place], las piernas o los tobillos ms hinchados (inflamados). Solicite ayuda de inmediato si:  Tiene fiebre.  Tiene una prdida de lquido por la vagina.  Tiene sangrado o pequeas prdidas vaginales.  Tiene clicos o dolor muy intensos en el vientre.  Sube o baja de peso rpidamente.  Vomita sangre. Esto tiene Art gallery manager similar a la borra del caf.  Est en contacto con personas que tienen rubola, la quinta enfermedad o varicela.  Siente un dolor de cabeza muy intenso.  Le falta el aire.  Sufre cualquier tipo de traumatismo, por ejemplo, debido a una cada o un accidente automovilstico. Resumen  El primer trimestre de Psychiatrist se extiende desde la semana1 hasta el final de la semana13 (mes1 al mes3).  Para cuidar su salud y la del beb en gestacin, necesitar consumir alimentos saludables, tomar medicamentos solamente si lo autoriza el mdico,  y hacer actividades que sean seguras para usted y para su beb.  Concurra a todas las visitas de control como se lo haya indicado el mdico. Esto es importante porque el mdico deber asegurar que el beb est saludable y est creciendo bien. Esta informacin no tiene Theme park manager el consejo del mdico. Asegrese de hacerle al mdico cualquier pregunta que tenga. Document Revised: 12/03/2016 Document Reviewed: 12/03/2016 Elsevier Patient Education  2020 [employer].

## 2020-03-22 NOTE — Progress Notes (Signed)
Patient Name: Elizabeth Kaiser Date of Birth: [birth year] [facility] Initial Prenatal Visit  Elizabeth Kaiser is a 19 y.o. year old G1P0 at [redacted]w[redacted]d who presents for her initial prenatal visit. Pregnancy is not planned She reports no symptoms. She is taking a prenatal vitamin.  She denies pelvic pain or vaginal bleeding.   Pregnancy Dating: . The patient is dated by [place].  Marland Kitchen LMP: August 15th . Period is certain:  Yes.  . Periods were regular:  Yes.  Marland Kitchen LMP was a typical period:  Yes.  . Using hormonal contraception in 3 months prior to conception: No  Lab Review: . Blood type: O . Rh Status: - . Antibody screen: Negative . HIV: Negative . RPR: Negative . Hemoglobin electrophoresis reviewed: Yes . Results of OB urine culture are: Negative . Rubella: Immune . Hep C Ab: Negative . Varicella status is Immune  PMH: Reviewed and as detailed below: . HTN: No  . Gestational Hypertension/preeclampsia: No  . Type 1 or 2 Diabetes: No  . Depression:  No  . Seizure disorder:  No . VTE: No ,  . History of STI No,  . Abnormal Pap smear:  No, . Genital herpes simplex:  No   PSH: . Gynecologic Surgery:  no . Surgical history reviewed, notable for: no  Obstetric History: . Obstetric history tab updated and reviewed.  Summary of prior pregnancies: none  Social History: . Partner's name: Elita Quick torres  . Tobacco use: No . Alcohol use:  No . Other substance use:  No  Current Medications:  . no  . Reviewed and appropriate in pregnancy.   Genetic and Infection Screen: . Flow Sheet Updated Yes  Prenatal Exam: Gen: Well nourished, well developed.  No distress.  Vitals noted. HEENT: Normocephalic, atraumatic.  Neck supple without cervical lymphadenopathy, thyromegaly or thyroid nodules.  Fair dentition. CV: RRR no murmur, gallops or rubs Lungs: CTA B.  Normal respiratory effort without wheezes or rales. Abd: soft, NTND. +BS. GU: Normal external female genitalia without  lesions.  Nl vaginal, well rugated without lesions. No vaginal discharge. No CMT.  Uterus size 13cm Ext: No clubbing, cyanosis or edema. Psych: Normal grooming and dress.  Not depressed or anxious appearing.  Fetal heart tones: Appropriate  Assessment/Plan:  Elizabeth Kaiser is a 19 y.o. G1P0 at [redacted]w[redacted]d who presents to initiate prenatal care. She is doing well.  Current pregnancy issues include weight loss.  1. Routine prenatal care: Marland Kitchen Pre-pregnancy weight updated. Expected weight gain this pregnancy is 25-35 pounds  . Prenatal labs reviewed, notable for none. . Indications for referral to HROB were reviewed and the patient does not meet criteria for referral.  . Medication list reviewed and updated.  . Recommended patient see a dentist for regular care.  . Bleeding and pain precautions reviewed. . Importance of prenatal vitamins reviewed.  . Genetic screening offered. Patient opted for: quad screen at 16-20 weeks. . The patient does not have an indication for aspirin therapy beginning at 12-16 weeks. Aspirin was not  recommended today.  . The patient will not be age 65 or over at time of delivery. Referral to genetic counseling was not offered today.  . The patient has the following risk factors for preexisting diabetes: Reviewed indications for early 1 hour glucose testing, not indicated . An early 1 hour glucose tolerance test was not ordered. . Pregnancy Medical Home and PHQ-9 forms completed, problems noted: Yes  2. Pregnancy issues include the following which were addressed today:  .  Quad screen at next appointment . Schedule anatomy US at next appointment . Monitor for weight gain . Confirm flu shot administered at health department   Follow up 4 weeks for next prenatal visit.

## 2020-03-23 LAB — CERVICOVAGINAL ANCILLARY ONLY
Chlamydia: NEGATIVE
Comment: NEGATIVE
Comment: NORMAL
Neisseria Gonorrhea: NEGATIVE

## 2020-04-21 ENCOUNTER — Encounter: Payer: Self-pay | Admitting: Student in an Organized Health Care Education/Training Program

## 2021-12-07 IMAGING — US US OB < 14 WEEKS - US OB TV
1 series · 15 of 28 positions shown · non-contrast
Comparison: 02/01/2020

CLINICAL DATA: 1st trimester pregnancy of unknown anatomic
location.

EXAM:
OBSTETRIC <14 WK US AND TRANSVAGINAL OB US
TECHNIQUE: Both transabdominal and transvaginal ultrasound examinations were
performed for complete evaluation of the gestation as well as the
maternal uterus, adnexal regions, and pelvic cul-de-sac.
Transvaginal technique was performed to assess early pregnancy.

[Series 1: us ob < 14 weeks - us ob tv · 64 acquisitions, 15 frames shown]
[im 1/64]
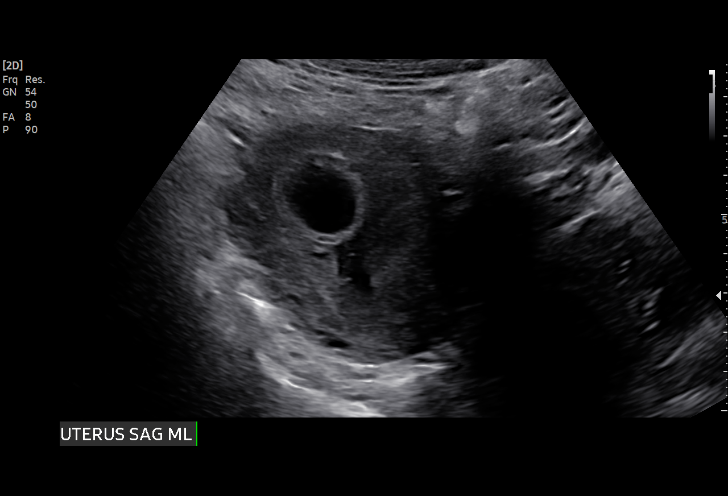
[im 5/64]
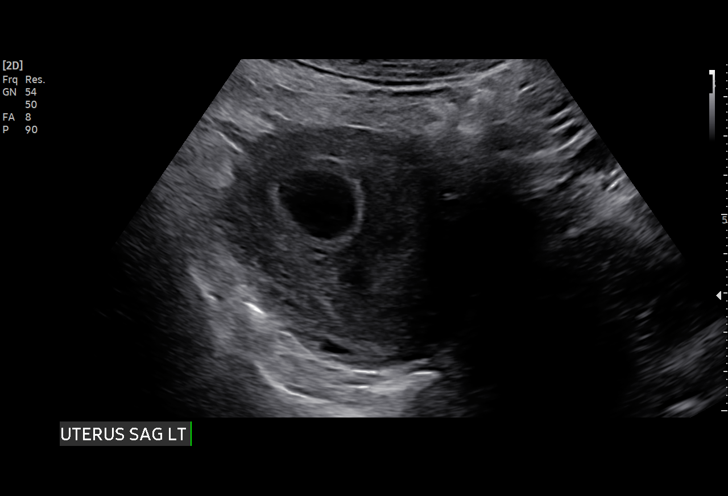
[im 10/64]
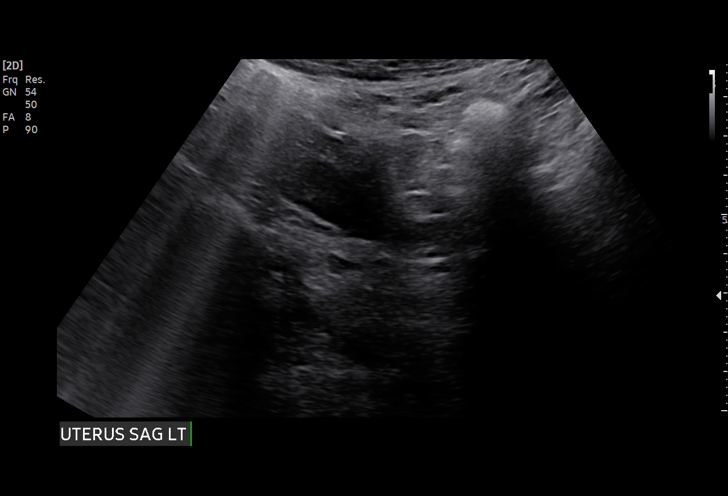
[im 15/64]
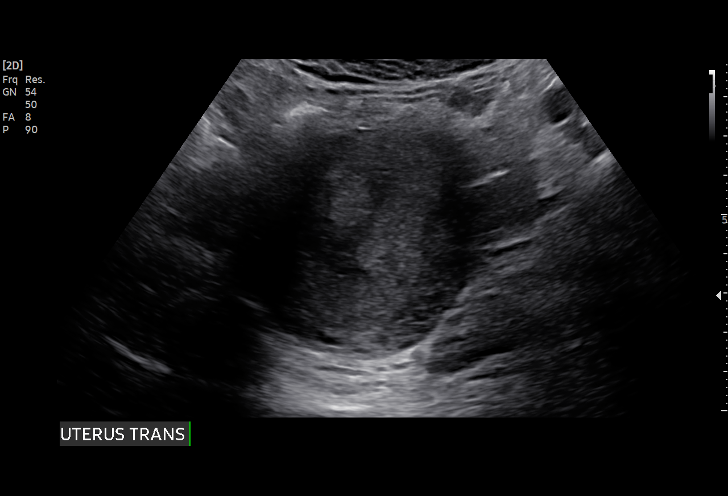
[im 19/64]
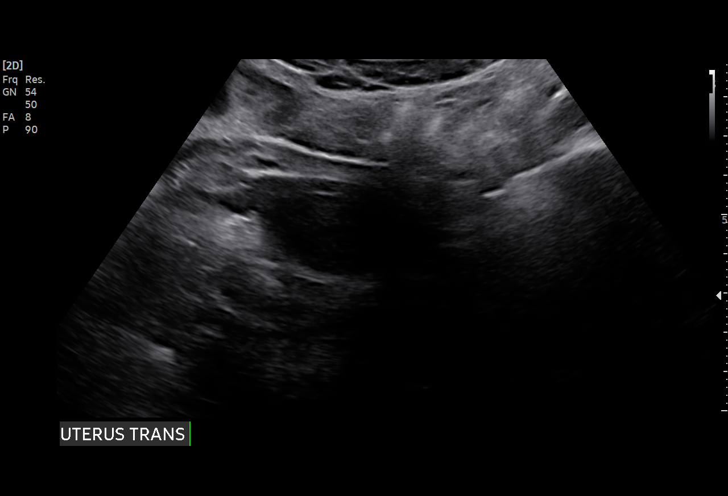
[im 24/64]
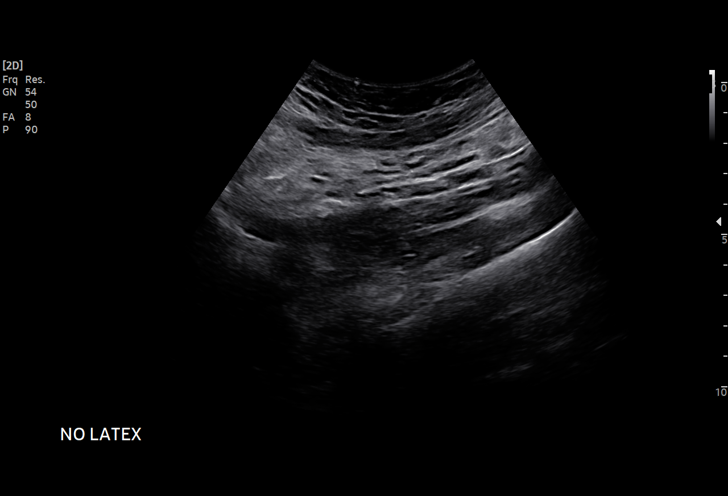
[im 29/64]
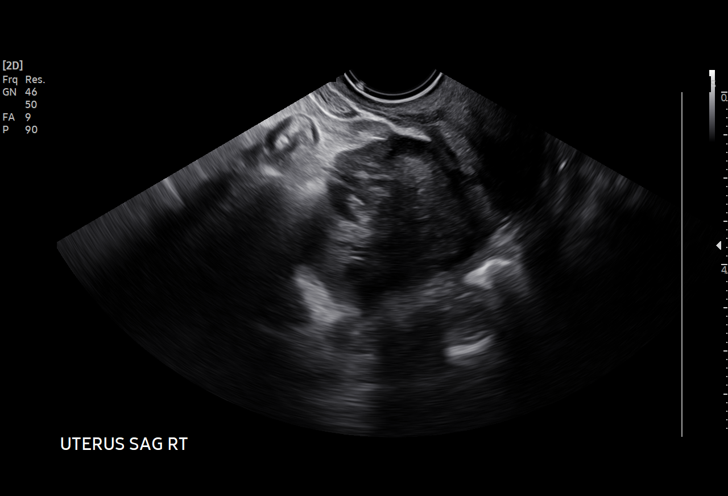
[im 33/64]
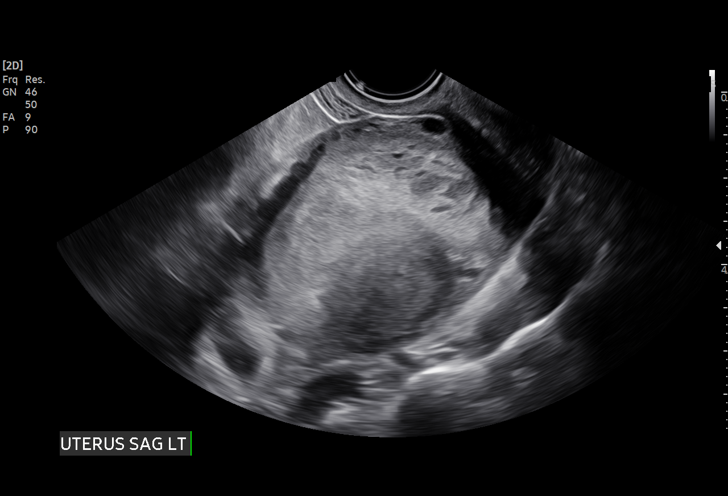
[im 36/64]
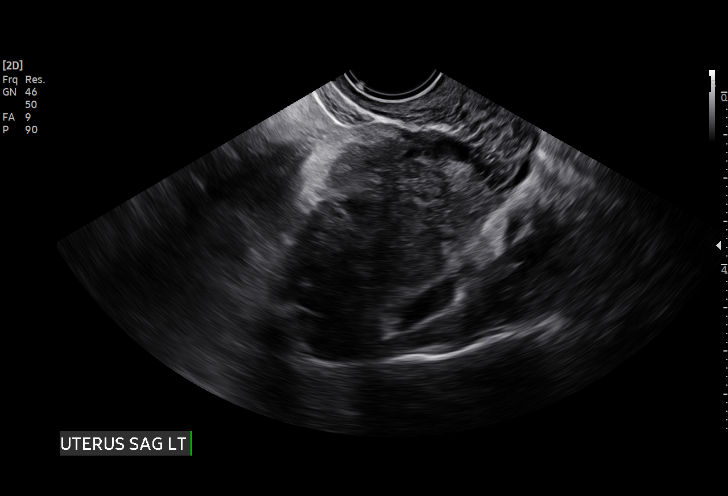
[im 40/64]
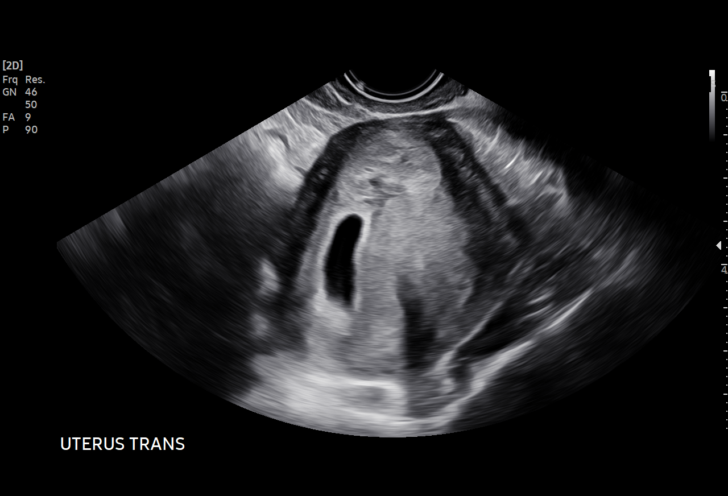
[im 45/64]
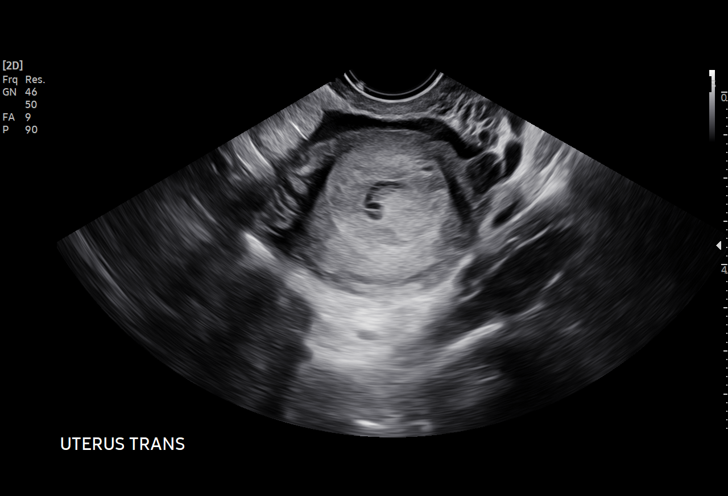
[im 50/64]
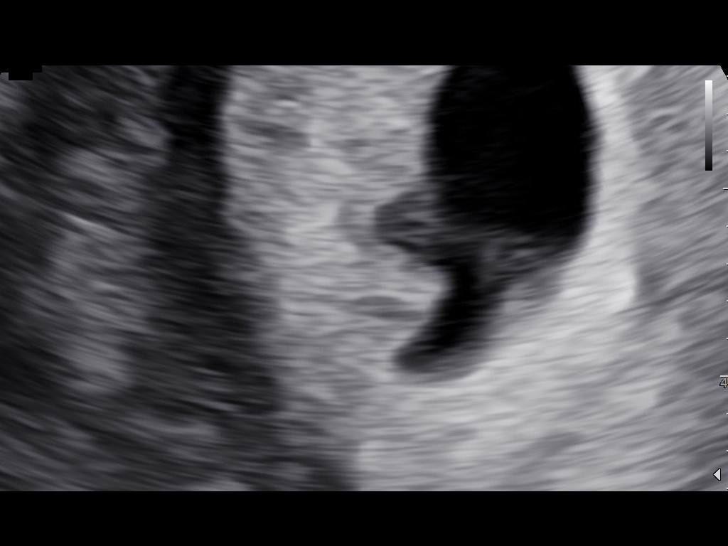
[im 54/64]
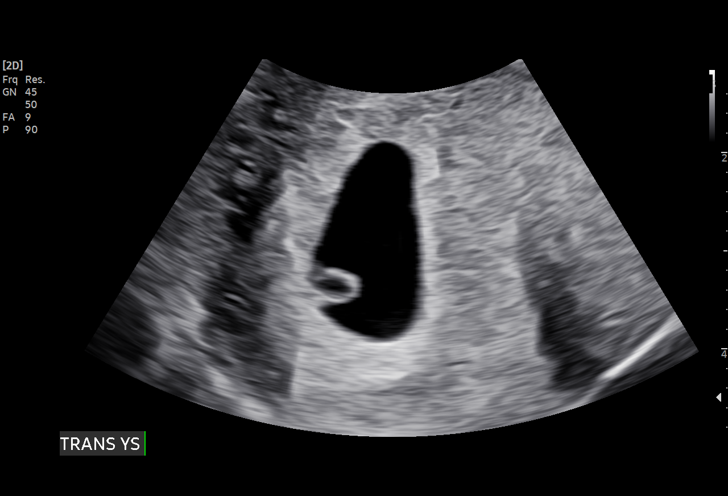
[im 59/64]
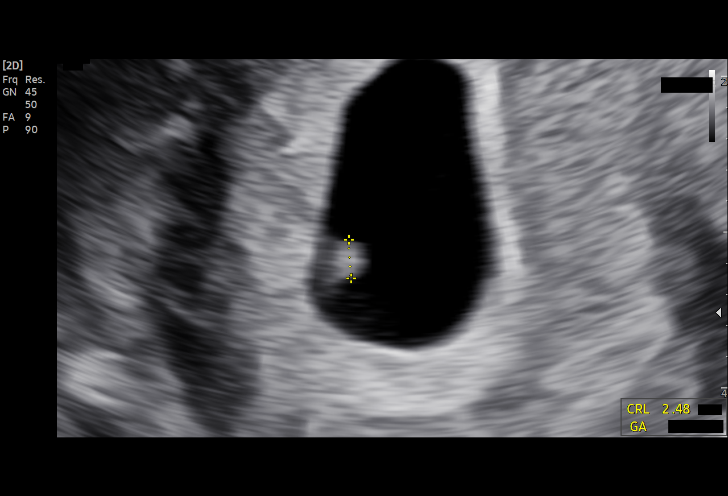
[im 64/64]
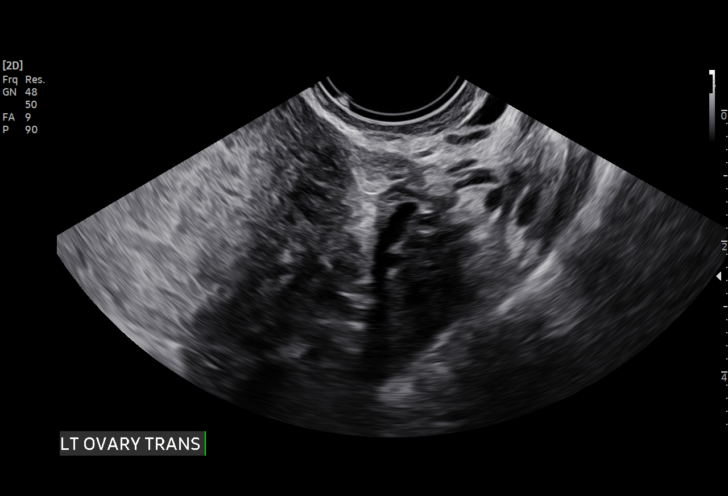

[15 of 28 positions shown; findings below may reference images not displayed]

FINDINGS: Intrauterine gestational sac: Single

Yolk sac:  Visualized.

Embryo:  Visualized.

Cardiac Activity: Visualized.

Heart Rate: 97 bpm

CRL:  3 mm   5 w   6 d                  US EDC: 10/11/2020

Subchorionic hemorrhage:  None visualized.

Maternal uterus/adnexae: No uterine fibroids identified. Both
ovaries are normal in appearance. No mass or abnormal free fluid
identified.
IMPRESSION: Single living IUP with estimated gestational age of 5 weeks 6 days,
and US EDC of 10/11/2020.
